# Patient Record
Sex: Male | Born: 1985 | Race: Black or African American | Hispanic: No | Marital: Single | Smoking: Never smoker
Health system: Southern US, Community
[De-identification: ages and names within clinical notes are randomized; demographics above are authoritative.]

---
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
## 2003-04-23 ENCOUNTER — Emergency Department (HOSPITAL_COMMUNITY): Admission: EM | Admit: 2003-04-23 | Discharge: 2003-04-23 | Payer: Self-pay | Admitting: Emergency Medicine

## 2004-01-18 ENCOUNTER — Emergency Department (HOSPITAL_COMMUNITY): Admission: EM | Admit: 2004-01-18 | Discharge: 2004-01-18 | Payer: Self-pay | Admitting: Emergency Medicine

## 2006-05-04 ENCOUNTER — Emergency Department (HOSPITAL_COMMUNITY): Admission: EM | Admit: 2006-05-04 | Discharge: 2006-05-04 | Payer: Self-pay | Admitting: Family Medicine

## 2006-10-11 ENCOUNTER — Emergency Department (HOSPITAL_COMMUNITY): Admission: EM | Admit: 2006-10-11 | Discharge: 2006-10-11 | Payer: Self-pay | Admitting: Family Medicine

## 2008-11-15 ENCOUNTER — Emergency Department (HOSPITAL_COMMUNITY): Admission: EM | Admit: 2008-11-15 | Discharge: 2008-11-15 | Payer: Self-pay | Admitting: Family Medicine

## 2010-06-08 ENCOUNTER — Emergency Department (HOSPITAL_COMMUNITY)
Admission: EM | Admit: 2010-06-08 | Discharge: 2010-06-08 | Payer: Self-pay | Source: Home / Self Care | Admitting: Emergency Medicine

## 2010-09-02 LAB — GC/CHLAMYDIA PROBE AMP, GENITAL
Chlamydia, DNA Probe: NEGATIVE
GC Probe Amp, Genital: NEGATIVE

## 2011-01-12 ENCOUNTER — Emergency Department (HOSPITAL_COMMUNITY)
Admission: EM | Admit: 2011-01-12 | Discharge: 2011-01-12 | Disposition: A | Discharge status: Home / Self Care | Payer: No Typology Code available for payment source | Attending: Emergency Medicine | Admitting: Emergency Medicine

## 2011-01-12 ENCOUNTER — Emergency Department (HOSPITAL_COMMUNITY): Payer: No Typology Code available for payment source

## 2011-01-12 DIAGNOSIS — M549 Dorsalgia, unspecified: Secondary | ICD-10-CM | POA: Insufficient documentation

## 2011-01-12 DIAGNOSIS — T148XXA Other injury of unspecified body region, initial encounter: Secondary | ICD-10-CM | POA: Insufficient documentation

## 2011-01-12 DIAGNOSIS — R51 Headache: Secondary | ICD-10-CM | POA: Insufficient documentation

## 2011-01-12 DIAGNOSIS — M542 Cervicalgia: Secondary | ICD-10-CM | POA: Insufficient documentation

## 2011-01-12 DIAGNOSIS — Y92410 Unspecified street and highway as the place of occurrence of the external cause: Secondary | ICD-10-CM | POA: Insufficient documentation

## 2011-01-14 ENCOUNTER — Emergency Department (HOSPITAL_COMMUNITY)
Admission: EM | Admit: 2011-01-14 | Discharge: 2011-01-15 | Disposition: A | Discharge status: Home / Self Care | Payer: No Typology Code available for payment source | Attending: Emergency Medicine | Admitting: Emergency Medicine

## 2011-01-14 DIAGNOSIS — M545 Low back pain, unspecified: Secondary | ICD-10-CM | POA: Insufficient documentation

## 2011-01-14 DIAGNOSIS — M25569 Pain in unspecified knee: Secondary | ICD-10-CM | POA: Insufficient documentation

## 2011-01-15 ENCOUNTER — Emergency Department (HOSPITAL_COMMUNITY): Payer: No Typology Code available for payment source
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
## 2012-04-26 ENCOUNTER — Encounter (HOSPITAL_COMMUNITY): Payer: Self-pay | Admitting: Emergency Medicine

## 2012-04-26 ENCOUNTER — Emergency Department (INDEPENDENT_AMBULATORY_CARE_PROVIDER_SITE_OTHER)
Admission: EM | Admit: 2012-04-26 | Discharge: 2012-04-26 | Disposition: A | Discharge status: Home / Self Care | Payer: Self-pay | Source: Home / Self Care | Attending: Emergency Medicine | Admitting: Emergency Medicine

## 2012-04-26 ENCOUNTER — Other Ambulatory Visit (HOSPITAL_COMMUNITY)
Admission: RE | Admit: 2012-04-26 | Discharge: 2012-04-26 | Disposition: A | Discharge status: Home / Self Care | Payer: Self-pay | Source: Ambulatory Visit | Attending: Emergency Medicine | Admitting: Emergency Medicine

## 2012-04-26 DIAGNOSIS — Z2089 Contact with and (suspected) exposure to other communicable diseases: Secondary | ICD-10-CM

## 2012-04-26 DIAGNOSIS — Z202 Contact with and (suspected) exposure to infections with a predominantly sexual mode of transmission: Secondary | ICD-10-CM

## 2012-04-26 DIAGNOSIS — Z113 Encounter for screening for infections with a predominantly sexual mode of transmission: Secondary | ICD-10-CM | POA: Insufficient documentation

## 2012-04-26 LAB — RPR: RPR Ser Ql: NONREACTIVE

## 2012-04-26 MED ORDER — METRONIDAZOLE 500 MG PO TABS: 2000.0000 mg | ORAL_TABLET | Freq: Once | ORAL | Status: AC

## 2012-04-26 NOTE — ED Notes (Signed)
Pt is here to be checked for STD... Girlfriend is being treated for Trich... Pt is asymptomatic... Denies: penile discharge, fevers, vomiting, diarrhea... Pt is alert w/no signs of acute distress.

## 2012-04-26 NOTE — ED Provider Notes (Signed)
History     CSN: [id number]  Arrival date & time 04/26/12  1020   First MD Initiated Contact with Patient 04/26/12 1234      Chief Complaint  Patient presents with  . Exposure to STD    (Consider location/radiation/quality/duration/timing/severity/associated sxs/prior treatment) Patient is a 26 y.o. male presenting with STD exposure. The history is provided by the patient.  Exposure to STD This is a new problem. The problem occurs daily. The problem has not changed since onset.Nothing aggravates the symptoms. Nothing relieves the symptoms. He has tried nothing for the symptoms.  Pt reports his girlfriend told him 2 days ago that she was positive for trichomoniasis.  History reviewed. No pertinent past medical history.  History reviewed. No pertinent past surgical history.  No family history on file.  History  Substance Use Topics  . Smoking status: Never Smoker   . Smokeless tobacco: Not on file  . Alcohol Use: Yes      Review of Systems  All other systems reviewed and are negative.    Allergies  Review of patient's allergies indicates no known allergies.  Home Medications   Current Outpatient Rx  Name  Route  Sig  Dispense  Refill  . METRONIDAZOLE 500 MG PO TABS   Oral   Take 4 tablets (2,000 mg total) by mouth once.   4 tablet   0     BP 127/68  Pulse 66  Temp 97.9 F (36.6 C) (Oral)  Resp 16  SpO2 99%  Physical Exam  Nursing note and vitals reviewed. Constitutional: He is oriented to person, place, and time. Vital signs are normal. He appears well-developed and well-nourished. He is active and cooperative.  HENT:  Head: Normocephalic.  Eyes: Conjunctivae normal are normal. Pupils are equal, round, and reactive to light. No scleral icterus.  Neck: Trachea normal. Neck supple.  Cardiovascular: Normal rate and regular rhythm.   Pulmonary/Chest: Effort normal and breath sounds normal.  Genitourinary: Testes normal and penis normal. Cremasteric  reflex is present.  Lymphadenopathy:       Right: No inguinal adenopathy present.       Left: No inguinal adenopathy present.  Neurological: He is alert and oriented to person, place, and time. No cranial nerve deficit or sensory deficit.  Skin: Skin is warm and dry. No rash noted.  Psychiatric: He has a normal mood and affect. His speech is normal and behavior is normal. Judgment and thought content normal. Cognition and memory are normal.    ED Course  Procedures (including critical care time)   Labs Reviewed  URINE CYTOLOGY ANCILLARY ONLY  HIV ANTIBODY (ROUTINE TESTING)  RPR   No results found.   1. Exposure to STD       MDM  Will treat empirically for trich.  Await gc/ct/hiv/rpr results.  Condoms for STD prevention.          Johnsie Kindred, NP 04/26/12 1242

## 2012-04-27 LAB — HIV ANTIBODY (ROUTINE TESTING W REFLEX): HIV: NONREACTIVE

## 2012-04-27 NOTE — ED Provider Notes (Signed)
Medical screening examination/treatment/procedure(s) were performed by non-physician practitioner and as supervising physician I was immediately available for consultation/collaboration.  Leslee Home, M.D.   Reuben Likes, MD 04/27/12 [phone]

## 2018-12-19 ENCOUNTER — Encounter (HOSPITAL_COMMUNITY): Payer: Self-pay | Admitting: Emergency Medicine

## 2018-12-19 ENCOUNTER — Other Ambulatory Visit: Payer: Self-pay

## 2018-12-19 ENCOUNTER — Emergency Department (HOSPITAL_COMMUNITY)
Admission: EM | Admit: 2018-12-19 | Discharge: 2018-12-19 | Disposition: A | Discharge status: Home / Self Care | Payer: BC Managed Care – PPO | Attending: Emergency Medicine | Admitting: Emergency Medicine

## 2018-12-19 DIAGNOSIS — Z20822 Contact with and (suspected) exposure to covid-19: Secondary | ICD-10-CM

## 2018-12-19 DIAGNOSIS — J029 Acute pharyngitis, unspecified: Secondary | ICD-10-CM | POA: Diagnosis not present

## 2018-12-19 DIAGNOSIS — H9203 Otalgia, bilateral: Secondary | ICD-10-CM | POA: Diagnosis present

## 2018-12-19 DIAGNOSIS — H9209 Otalgia, unspecified ear: Secondary | ICD-10-CM

## 2018-12-19 DIAGNOSIS — R51 Headache: Secondary | ICD-10-CM | POA: Insufficient documentation

## 2018-12-19 DIAGNOSIS — Z20828 Contact with and (suspected) exposure to other viral communicable diseases: Secondary | ICD-10-CM | POA: Diagnosis not present

## 2018-12-19 DIAGNOSIS — R519 Headache, unspecified: Secondary | ICD-10-CM

## 2018-12-19 NOTE — ED Notes (Signed)
E-signature not available, verbalized understanding of DC instructions. 
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
## 2018-12-19 NOTE — Discharge Instructions (Addendum)
Take Ibuprofen as needed for headache or body aches. Make sure you are staying well-hydrated water. You are being tested for coronavirus.  If results are positive, you will receive a phone call.  If negative, you will not.  Either way, you may check online on MyChart. It is important that you quarantine as if you have a virus until you have the results. Monitor for signs of worsening breathing.  If you are having any difficulty breathing or if you like your breathing too quickly, return to the emergency room for further evaluation.

## 2018-12-19 NOTE — ED Triage Notes (Signed)
Pt in POV, reports HA/L ear ache X few days. States he was recently around someone that tested positive for COVID and he wants to be tested as well. VSS.

## 2018-12-20 LAB — NOVEL CORONAVIRUS, NAA (HOSP ORDER, SEND-OUT TO REF LAB; TAT 18-24 HRS): SARS-CoV-2, NAA: NOT DETECTED

## 2019-01-25 ENCOUNTER — Encounter (HOSPITAL_COMMUNITY): Payer: Self-pay

## 2019-01-25 ENCOUNTER — Ambulatory Visit (HOSPITAL_COMMUNITY)
Admission: EM | Admit: 2019-01-25 | Discharge: 2019-01-25 | Disposition: A | Discharge status: Home / Self Care | Payer: BC Managed Care – PPO | Attending: Family Medicine | Admitting: Family Medicine

## 2019-01-25 ENCOUNTER — Other Ambulatory Visit: Payer: Self-pay

## 2019-01-25 DIAGNOSIS — Z20828 Contact with and (suspected) exposure to other viral communicable diseases: Secondary | ICD-10-CM | POA: Insufficient documentation

## 2019-01-25 DIAGNOSIS — R0981 Nasal congestion: Secondary | ICD-10-CM | POA: Diagnosis not present

## 2019-01-25 DIAGNOSIS — R05 Cough: Secondary | ICD-10-CM

## 2019-01-25 DIAGNOSIS — Z20822 Contact with and (suspected) exposure to covid-19: Secondary | ICD-10-CM

## 2019-01-25 DIAGNOSIS — R6889 Other general symptoms and signs: Secondary | ICD-10-CM | POA: Diagnosis present

## 2019-01-25 DIAGNOSIS — R059 Cough, unspecified: Secondary | ICD-10-CM

## 2019-01-25 NOTE — ED Provider Notes (Signed)
[facility]    CSN: [id number] Arrival date & time: 01/25/19  1905      History   Chief Complaint Chief Complaint  Patient presents with  . URI    HPI Tom Page is a 33 y.o. male.   Patient presents with nasal congestion, rhinorrhea, and nonproductive cough x 2 days.  He denies fever, chills, shortness of breath, abdominal pain, vomiting, or diarrhea.  He request COVID testing.     The history is provided by the patient.    History reviewed. No pertinent past medical history.  There are no active problems to display for this patient.   History reviewed. No pertinent surgical history.     Home Medications    Prior to Admission medications   Medication Sig Start Date End Date Taking? Authorizing Provider  metroNIDAZOLE (FLAGYL) 500 MG tablet Take 4 tablets (2,000 mg total) by mouth once. 04/26/12   Chatten, Katherine Bassetarmen L, NP    Family History Family History  Family history unknown: Yes    Social History Social History   Tobacco Use  . Smoking status: Never Smoker  . Smokeless tobacco: Never Used  Substance Use Topics  . Alcohol use: Yes  . Drug use: Yes    Types: Marijuana     Allergies   Patient has no known allergies.   Review of Systems Review of Systems  Constitutional: Negative for chills and fever.  HENT: Positive for congestion and rhinorrhea. Negative for ear pain and sore throat.   Eyes: Negative for pain and visual disturbance.  Respiratory: Positive for cough. Negative for shortness of breath.   Cardiovascular: Negative for chest pain and palpitations.  Gastrointestinal: Negative for abdominal pain, diarrhea and vomiting.  Genitourinary: Negative for dysuria and hematuria.  Musculoskeletal: Negative for arthralgias and back pain.  Skin: Negative for color change and rash.  Neurological: Negative for seizures and syncope.  All other systems reviewed and are negative.    Physical Exam Triage Vital Signs ED Triage Vitals  [01/25/19 2010]  Enc Vitals Group     BP 129/87     Pulse Rate 74     Resp 18     Temp 98.2 F (36.8 C)     Temp Source Oral     SpO2 100 %     Weight      Height      Head Circumference      Peak Flow      Pain Score 5     Pain Loc      Pain Edu?      Excl. in GC?    No data found.  Updated Vital Signs BP 129/87 (BP Location: Right Arm)   Pulse 74   Temp 98.2 F (36.8 C) (Oral)   Resp 18   SpO2 100%   Visual Acuity Right Eye Distance:   Left Eye Distance:   Bilateral Distance:    Right Eye Near:   Left Eye Near:    Bilateral Near:     Physical Exam Vitals signs and nursing note reviewed.  Constitutional:      Appearance: He is well-developed.  HENT:     Head: Normocephalic and atraumatic.     Right Ear: Tympanic membrane normal.     Left Ear: Tympanic membrane normal.     Nose: Congestion and rhinorrhea present.     Mouth/Throat:     Mouth: Mucous membranes are moist.     Pharynx: Oropharynx is clear.  Eyes:  Conjunctiva/sclera: Conjunctivae normal.  Neck:     Musculoskeletal: Neck supple.  Cardiovascular:     Rate and Rhythm: Normal rate and regular rhythm.     Heart sounds: No murmur.  Pulmonary:     Effort: Pulmonary effort is normal. No respiratory distress.     Breath sounds: Normal breath sounds.  Abdominal:     Palpations: Abdomen is soft.     Tenderness: There is no abdominal tenderness. There is no guarding or rebound.  Skin:    General: Skin is warm and dry.     Findings: No rash.  Neurological:     Mental Status: He is alert.      UC Treatments / Results  Labs (all labs ordered are listed, but only abnormal results are displayed) Labs Reviewed  NOVEL CORONAVIRUS, NAA (HOSP ORDER, SEND-OUT TO REF LAB; TAT 18-24 HRS)    EKG   Radiology No results found.  Procedures Procedures (including critical care time)  Medications Ordered in UC Medications - No data to display  Initial Impression / Assessment and Plan / UC  Course  I have reviewed the triage vital signs and the nursing notes.  Pertinent labs & imaging results that were available during my care of the patient were reviewed by me and considered in my medical decision making (see chart for details).    Cough, suspected COVID.  Your COVID test is pending.  You should self quarantine until your test result is back and is negative.    Go to the emergency department if you develop worsening cough, shortness of breath, high fever, severe diarrhea, or other concerning symptoms.     Final Clinical Impressions(s) / UC Diagnoses   Final diagnoses:  Cough  Suspected Covid-19 Virus Infection     Discharge Instructions     Your COVID test is pending.  You should self quarantine until your test result is back and is negative.    Go to the emergency department if you develop shortness of breath, high fever, severe diarrhea, or other concerning symptoms.         ED Prescriptions    None     Controlled Substance Prescriptions [place] Controlled Substance Registry consulted? Not Applicable   Sharion Balloon, NP 01/25/19 2115

## 2019-01-25 NOTE — ED Triage Notes (Signed)
Pt presents with persistent productive cough, nasal drainage, chest discomfort, and generalized body aches since yesterday.

## 2019-01-25 NOTE — Discharge Instructions (Addendum)
Your COVID test is pending.  You should self quarantine until your test result is back and is negative.   ° °Go to the emergency department if you develop shortness of breath, high fever, severe diarrhea, or other concerning symptoms.   ° ° ° °

## 2019-01-27 LAB — NOVEL CORONAVIRUS, NAA (HOSP ORDER, SEND-OUT TO REF LAB; TAT 18-24 HRS): SARS-CoV-2, NAA: NOT DETECTED

## 2019-01-28 ENCOUNTER — Encounter (HOSPITAL_COMMUNITY): Payer: Self-pay

## 2020-09-03 ENCOUNTER — Emergency Department (HOSPITAL_COMMUNITY)
Admission: EM | Admit: 2020-09-03 | Discharge: 2020-09-03 | Disposition: A | Discharge status: Home / Self Care | Payer: Self-pay | Attending: Emergency Medicine | Admitting: Emergency Medicine

## 2020-09-03 ENCOUNTER — Other Ambulatory Visit: Payer: Self-pay

## 2020-09-03 ENCOUNTER — Emergency Department (HOSPITAL_COMMUNITY): Payer: Self-pay

## 2020-09-03 ENCOUNTER — Encounter (HOSPITAL_COMMUNITY): Payer: Self-pay

## 2020-09-03 DIAGNOSIS — S20221A Contusion of right back wall of thorax, initial encounter: Secondary | ICD-10-CM | POA: Insufficient documentation

## 2020-09-03 DIAGNOSIS — R0789 Other chest pain: Secondary | ICD-10-CM | POA: Insufficient documentation

## 2020-09-03 DIAGNOSIS — S6991XA Unspecified injury of right wrist, hand and finger(s), initial encounter: Secondary | ICD-10-CM | POA: Insufficient documentation

## 2020-09-03 LAB — CBC
HCT: 46.1 % (ref 39.0–52.0)
Hemoglobin: 15.3 g/dL (ref 13.0–17.0)
MCH: 29.1 pg (ref 26.0–34.0)
MCHC: 33.2 g/dL (ref 30.0–36.0)
MCV: 87.6 fL (ref 80.0–100.0)
Platelets: 284 10*3/uL (ref 150–400)
RBC: 5.26 MIL/uL (ref 4.22–5.81)
RDW: 14.4 % (ref 11.5–15.5)
WBC: 8.5 10*3/uL (ref 4.0–10.5)
nRBC: 0 % (ref 0.0–0.2)

## 2020-09-03 LAB — COMPREHENSIVE METABOLIC PANEL
ALT: 39 U/L (ref 0–44)
AST: 52 U/L — ABNORMAL HIGH (ref 15–41)
Albumin: 4.4 g/dL (ref 3.5–5.0)
Alkaline Phosphatase: 58 U/L (ref 38–126)
Anion gap: 9 (ref 5–15)
BUN: 15 mg/dL (ref 6–20)
CO2: 28 mmol/L (ref 22–32)
Calcium: 9.3 mg/dL (ref 8.9–10.3)
Chloride: 101 mmol/L (ref 98–111)
Creatinine, Ser: 1.25 mg/dL — ABNORMAL HIGH (ref 0.61–1.24)
GFR, Estimated: >60 mL/min (ref >60)
Glucose, Bld: 104 mg/dL — ABNORMAL HIGH (ref 70–99)
Potassium: 3.4 mmol/L — ABNORMAL LOW (ref 3.5–5.1)
Sodium: 138 mmol/L (ref 135–145)
Total Bilirubin: 1.6 mg/dL — ABNORMAL HIGH (ref 0.3–1.2)
Total Protein: 7.7 g/dL (ref 6.5–8.1)

## 2020-09-03 LAB — TROPONIN I (HIGH SENSITIVITY): Troponin I (High Sensitivity): 8 ng/L (ref <18)

## 2020-09-03 MED ORDER — IBUPROFEN 200 MG PO TABS
600.0000 mg | ORAL_TABLET | Freq: Once | ORAL | Status: AC
  Administered 2020-09-03: 600 mg via ORAL
  Filled 2020-09-03: qty 3
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
## 2020-09-03 NOTE — Discharge Instructions (Signed)
Please take Ibuprofen (Advil, motrin) and Tylenol (acetaminophen) to relieve your pain.    You may take up to 600 MG (3 pills) of normal strength ibuprofen every 8 hours as needed.   You make take tylenol, up to 1,000 mg (two extra strength pills) every 8 hours as needed.   It is safe to take ibuprofen and tylenol at the same time as they work differently.   Do not take more than 3,000 mg tylenol in a 24 hour period (not more than one dose every 8 hours.  Please check all medication labels as many medications such as pain and cold medications may contain tylenol.  Do not drink alcohol while taking these medications.  Do not take other NSAID'S while taking ibuprofen (such as aleve or naproxen).  Please take ibuprofen with food to decrease stomach upset.   If you develop fevers, shortness of breath, have any new or concerning symptoms please seek additional medical care and evaluation.

## 2020-09-03 NOTE — ED Triage Notes (Signed)
Patient c/o mid chest pain that radiates into the upper back when he takes a deep breath. Patient denies any SOB.

## 2021-11-01 IMAGING — CR DG CHEST 2V
2 series · 2 of 2 positions shown · non-contrast
Comparison: None.

CLINICAL DATA: Chest pain

EXAM:
CHEST - 2 VIEW

[w chest pa]
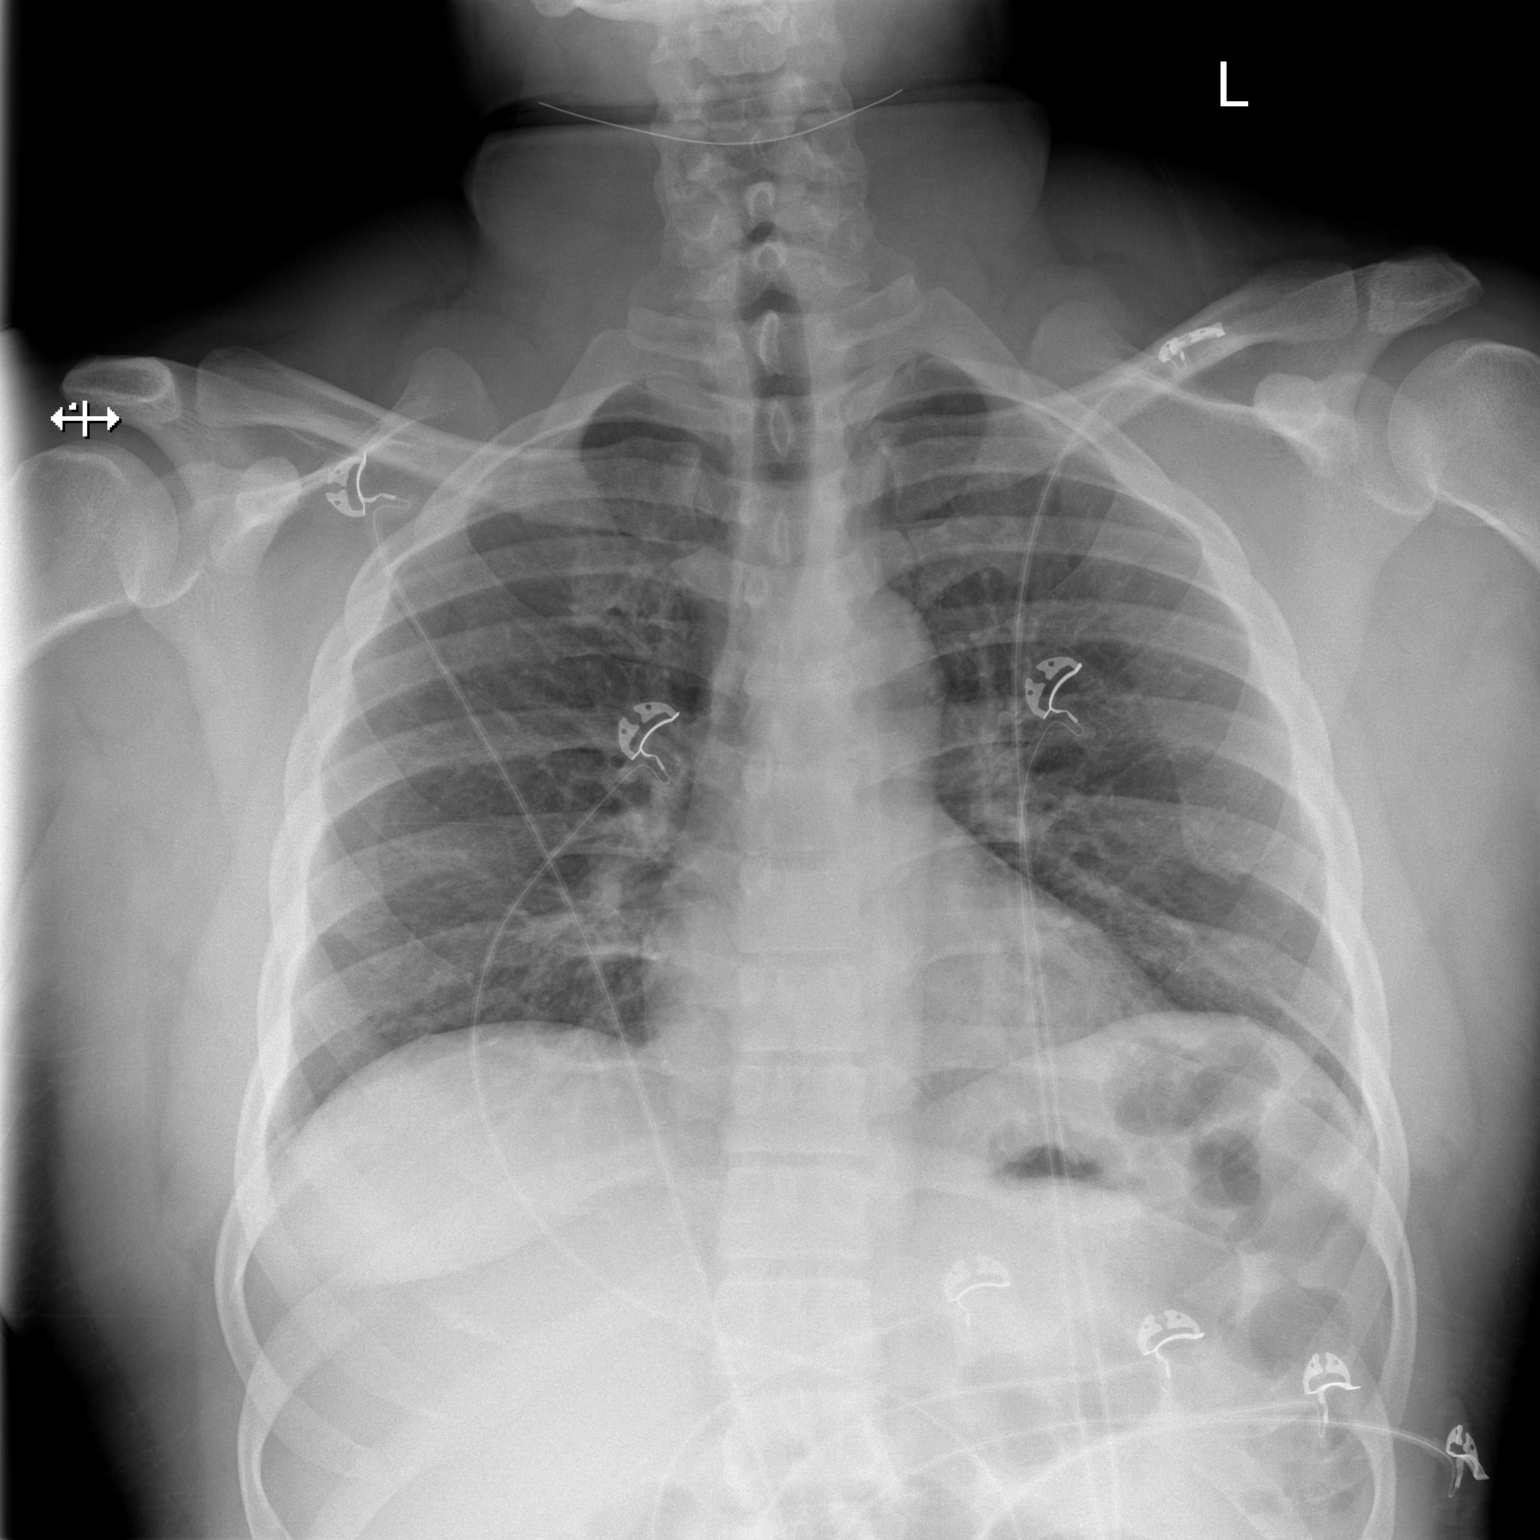

[w chest lat]
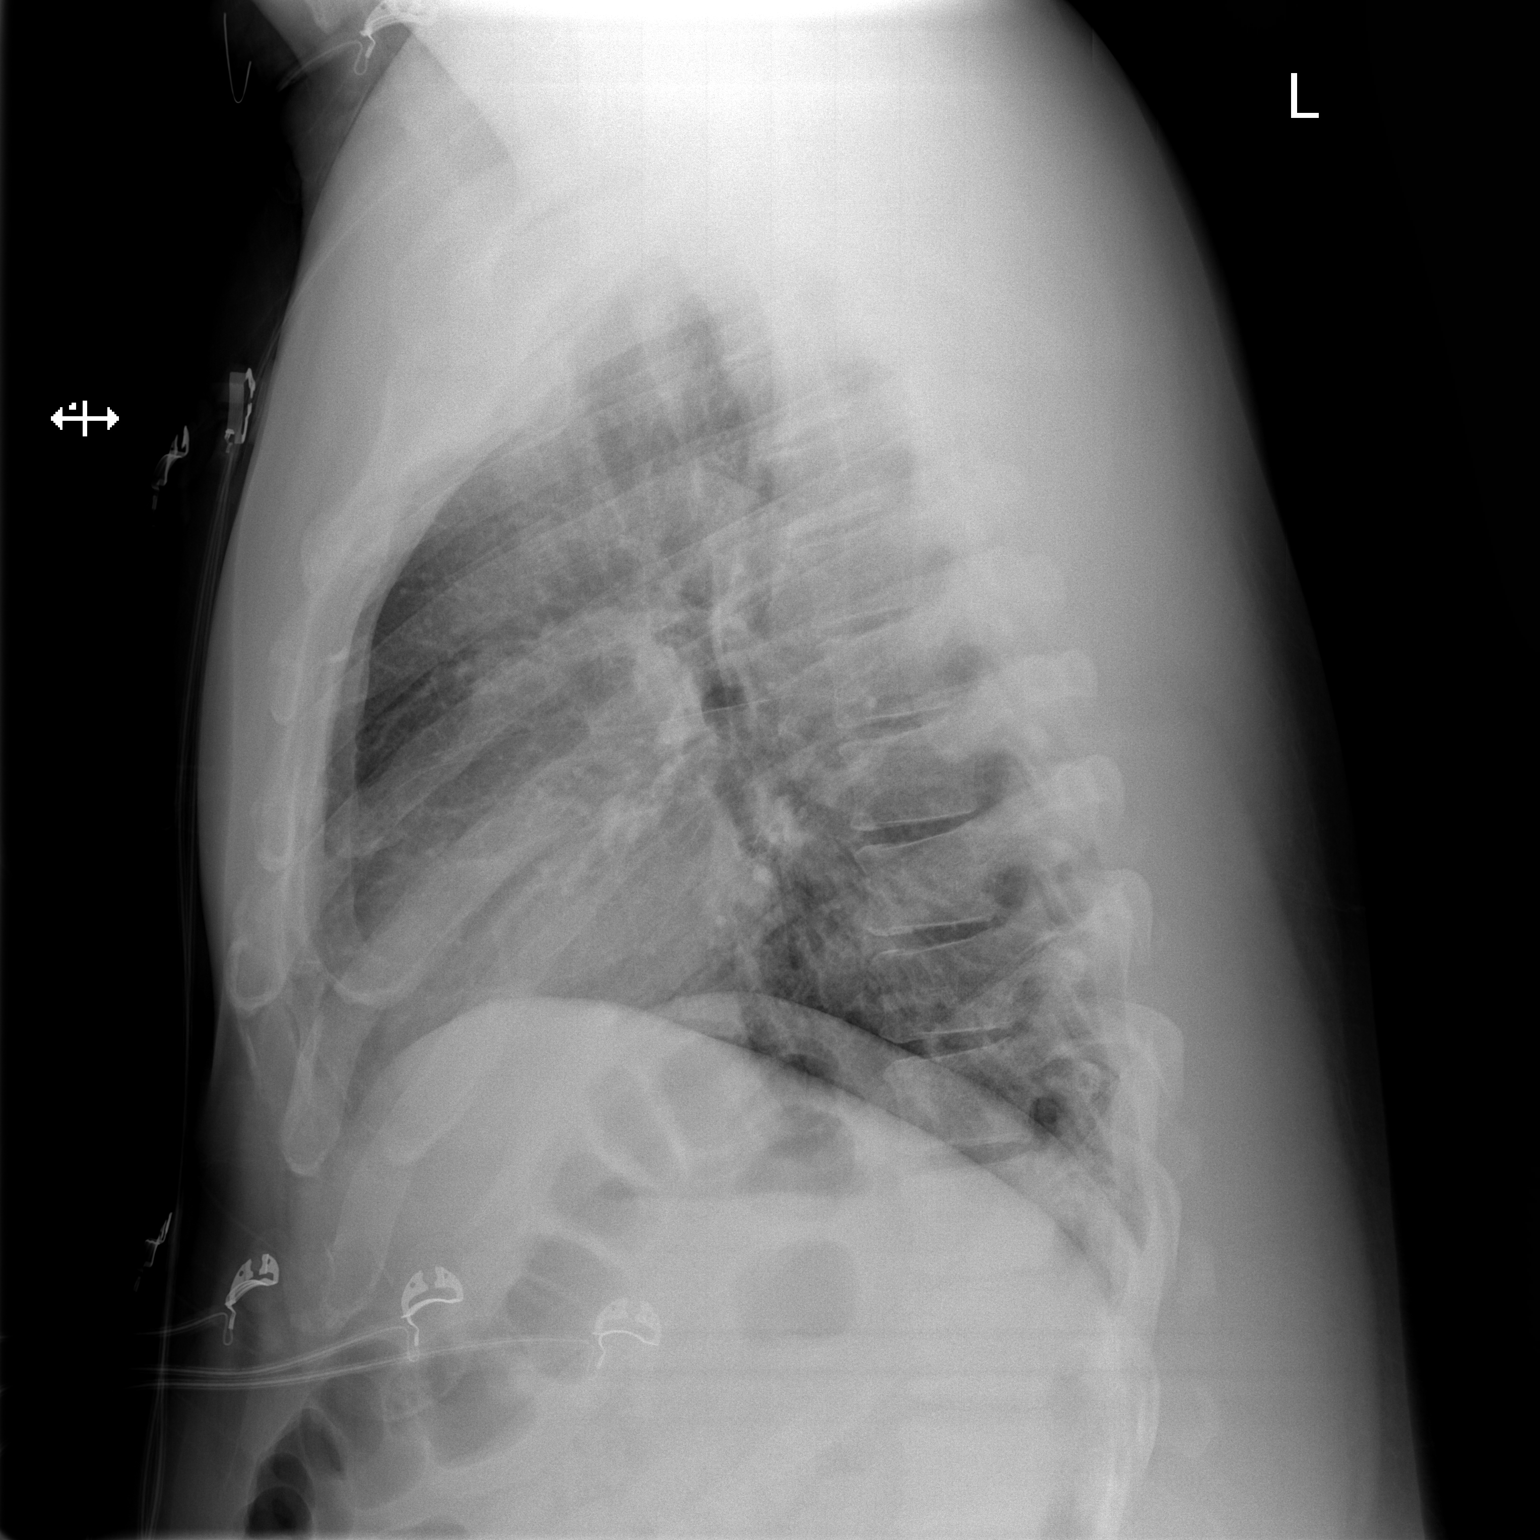

[2 of 2 positions shown; findings below may reference images not displayed]

FINDINGS: Lungs are clear. Heart size and pulmonary vascularity are normal. No
adenopathy. No pneumothorax. No bone lesions.
IMPRESSION: Lungs clear.  Cardiac silhouette normal.

## 2022-06-11 ENCOUNTER — Ambulatory Visit: Payer: Self-pay | Admitting: Medical
